# Patient Record
Sex: Female | Born: 1960 | Race: White | Hispanic: No | Smoking: Former smoker
Health system: Southern US, Community
[De-identification: ages and names within clinical notes are randomized; demographics above are authoritative.]

## PROBLEM LIST (undated history)

## (undated) DIAGNOSIS — M8580 Other specified disorders of bone density and structure, unspecified site: Secondary | ICD-10-CM

## (undated) DIAGNOSIS — E559 Vitamin D deficiency, unspecified: Secondary | ICD-10-CM

## (undated) HISTORY — DX: Vitamin D deficiency, unspecified: E55.9

## (undated) HISTORY — DX: Other specified disorders of bone density and structure, unspecified site: M85.80

---

## 2007-08-17 ENCOUNTER — Ambulatory Visit (HOSPITAL_COMMUNITY): Admission: RE | Admit: 2007-08-17 | Discharge: 2007-08-17 | Payer: Self-pay | Admitting: Family Medicine

## 2007-08-21 ENCOUNTER — Encounter: Admission: RE | Admit: 2007-08-21 | Discharge: 2007-08-21 | Payer: Self-pay | Admitting: Family Medicine

## 2007-08-29 ENCOUNTER — Other Ambulatory Visit: Admission: RE | Admit: 2007-08-29 | Discharge: 2007-08-29 | Payer: Self-pay | Admitting: Family Medicine

## 2008-06-27 ENCOUNTER — Ambulatory Visit (HOSPITAL_COMMUNITY): Admission: RE | Admit: 2008-06-27 | Discharge: 2008-06-27 | Payer: Self-pay | Admitting: Family Medicine

## 2008-07-04 ENCOUNTER — Encounter: Admission: RE | Admit: 2008-07-04 | Discharge: 2008-07-04 | Payer: Self-pay | Admitting: Family Medicine

## 2009-01-09 ENCOUNTER — Encounter: Admission: RE | Admit: 2009-01-09 | Discharge: 2009-01-09 | Payer: Self-pay | Admitting: Family Medicine

## 2010-12-20 ENCOUNTER — Encounter: Payer: Self-pay | Admitting: Family Medicine

## 2013-04-10 ENCOUNTER — Other Ambulatory Visit (HOSPITAL_COMMUNITY)
Admission: RE | Admit: 2013-04-10 | Discharge: 2013-04-10 | Disposition: A | Discharge status: Home / Self Care | Payer: BC Managed Care – PPO | Source: Ambulatory Visit | Attending: Family Medicine | Admitting: Family Medicine

## 2013-04-10 ENCOUNTER — Other Ambulatory Visit: Payer: Self-pay | Admitting: Family Medicine

## 2013-04-10 DIAGNOSIS — Z124 Encounter for screening for malignant neoplasm of cervix: Secondary | ICD-10-CM | POA: Insufficient documentation

## 2013-04-25 ENCOUNTER — Other Ambulatory Visit: Payer: Self-pay

## 2013-04-30 ENCOUNTER — Other Ambulatory Visit: Payer: Self-pay | Admitting: Family Medicine

## 2013-04-30 DIAGNOSIS — E2839 Other primary ovarian failure: Secondary | ICD-10-CM

## 2013-05-03 ENCOUNTER — Other Ambulatory Visit: Payer: Self-pay

## 2013-05-03 DIAGNOSIS — Z1231 Encounter for screening mammogram for malignant neoplasm of breast: Secondary | ICD-10-CM

## 2013-06-10 ENCOUNTER — Ambulatory Visit: Payer: Self-pay

## 2013-06-10 ENCOUNTER — Other Ambulatory Visit: Payer: Self-pay

## 2013-06-12 ENCOUNTER — Ambulatory Visit
Admission: RE | Admit: 2013-06-12 | Discharge: 2013-06-12 | Disposition: A | Discharge status: Home / Self Care | Payer: BC Managed Care – PPO | Source: Ambulatory Visit | Attending: Family Medicine | Admitting: Family Medicine

## 2013-06-12 ENCOUNTER — Ambulatory Visit
Admission: RE | Admit: 2013-06-12 | Discharge: 2013-06-12 | Disposition: A | Discharge status: Home / Self Care | Payer: BC Managed Care – PPO | Source: Ambulatory Visit

## 2013-06-12 DIAGNOSIS — Z1231 Encounter for screening mammogram for malignant neoplasm of breast: Secondary | ICD-10-CM

## 2013-06-12 DIAGNOSIS — E2839 Other primary ovarian failure: Secondary | ICD-10-CM

## 2014-05-26 ENCOUNTER — Other Ambulatory Visit: Payer: Self-pay

## 2014-05-26 DIAGNOSIS — Z1231 Encounter for screening mammogram for malignant neoplasm of breast: Secondary | ICD-10-CM

## 2014-05-29 ENCOUNTER — Other Ambulatory Visit: Payer: Self-pay | Admitting: Family Medicine

## 2014-05-29 ENCOUNTER — Other Ambulatory Visit (HOSPITAL_COMMUNITY)
Admission: RE | Admit: 2014-05-29 | Discharge: 2014-05-29 | Disposition: A | Discharge status: Home / Self Care | Payer: BC Managed Care – PPO | Source: Ambulatory Visit | Attending: Family Medicine | Admitting: Family Medicine

## 2014-05-29 DIAGNOSIS — Z1151 Encounter for screening for human papillomavirus (HPV): Secondary | ICD-10-CM | POA: Insufficient documentation

## 2014-05-29 DIAGNOSIS — Z01419 Encounter for gynecological examination (general) (routine) without abnormal findings: Secondary | ICD-10-CM | POA: Insufficient documentation

## 2014-06-02 LAB — CYTOLOGY - PAP

## 2014-06-26 ENCOUNTER — Ambulatory Visit
Admission: RE | Admit: 2014-06-26 | Discharge: 2014-06-26 | Disposition: A | Discharge status: Home / Self Care | Payer: BC Managed Care – PPO | Source: Ambulatory Visit

## 2014-06-26 DIAGNOSIS — Z1231 Encounter for screening mammogram for malignant neoplasm of breast: Secondary | ICD-10-CM

## 2015-06-05 ENCOUNTER — Other Ambulatory Visit: Payer: Self-pay | Admitting: Family Medicine

## 2015-06-05 DIAGNOSIS — M8580 Other specified disorders of bone density and structure, unspecified site: Secondary | ICD-10-CM

## 2015-06-26 ENCOUNTER — Other Ambulatory Visit: Payer: Self-pay

## 2015-06-26 ENCOUNTER — Other Ambulatory Visit: Payer: Self-pay | Admitting: Family Medicine

## 2015-06-26 DIAGNOSIS — Z1231 Encounter for screening mammogram for malignant neoplasm of breast: Secondary | ICD-10-CM

## 2015-07-01 ENCOUNTER — Ambulatory Visit
Admission: RE | Admit: 2015-07-01 | Discharge: 2015-07-01 | Disposition: A | Discharge status: Home / Self Care | Payer: BLUE CROSS/BLUE SHIELD | Source: Ambulatory Visit

## 2015-07-01 ENCOUNTER — Ambulatory Visit
Admission: RE | Admit: 2015-07-01 | Discharge: 2015-07-01 | Disposition: A | Discharge status: Home / Self Care | Payer: BLUE CROSS/BLUE SHIELD | Source: Ambulatory Visit | Attending: Family Medicine | Admitting: Family Medicine

## 2015-07-01 DIAGNOSIS — M8580 Other specified disorders of bone density and structure, unspecified site: Secondary | ICD-10-CM

## 2015-07-01 DIAGNOSIS — Z1231 Encounter for screening mammogram for malignant neoplasm of breast: Secondary | ICD-10-CM

## 2015-09-23 ENCOUNTER — Ambulatory Visit (INDEPENDENT_AMBULATORY_CARE_PROVIDER_SITE_OTHER): Payer: BLUE CROSS/BLUE SHIELD | Admitting: Interventional Cardiology

## 2015-09-23 ENCOUNTER — Encounter: Payer: Self-pay | Admitting: Interventional Cardiology

## 2015-09-23 VITALS — BP 100/66 | HR 60 | Ht 64.0 in | Wt 178.1 lb

## 2015-09-23 DIAGNOSIS — R0609 Other forms of dyspnea: Secondary | ICD-10-CM

## 2015-09-23 NOTE — Patient Instructions (Signed)
Medication Instructions:  Same-no changes  Labwork: None  Testing/Procedures: Your physician has requested that you have a stress echocardiogram. For further information please visit [email or web address]. Please follow instruction sheet as given.   Follow-Up: Your physician recommends that you schedule a follow-up appointment in: as needed      If you need a refill on your cardiac medications before your next appointment, please call your pharmacy.   

## 2015-09-23 NOTE — Progress Notes (Signed)
Patient ID: Latoya Cunningham, female   DOB: [birth year], 54 y.o.   MRN: [id number]     Cardiology Office Note   Date:  09/23/2015   ID:  Tyrina Hines, DOB 1961-10-15, MRN [id number]  PCP:  Gaye Alken, MD    No chief complaint on file. shortness of breath   Wt Readings from Last 3 Encounters:  09/23/15 178 lb 1.9 oz (80.795 kg)       History of Present Illness: Latoya Cunningham is a 54 y.o. female  who has seen several years ago for shortness of breath. She was referred for a stress test but did not follow-up for the test. She had been doing well until the last few months. She has noticed increasing shortness of breath, particularly with exertion. [place] and had significant shortness of breath to the point that she had to stop walking. She denies any discomfort in her chest. No associated sweating, nausea, arm pain.  No lower extremity swelling.  She reports easy bruising. She had been taking some over-the-counter supplements for back pain. Her liver tests apparently were elevated. When she's changed her medicines, they came down. She now takes Naprosyn for back pain.    Past Medical History  Diagnosis Date  . Vitamin D deficiency   . Osteopenia     No past surgical history on file.   Current Outpatient Prescriptions  Medication Sig Dispense Refill  . cholecalciferol (VITAMIN D) 1000 UNITS tablet Take 1,000 Units by mouth daily.    . naproxen sodium (ANAPROX) 220 MG tablet Take 220 mg by mouth 2 (two) times daily as needed (Back Pain).      No current facility-administered medications for this visit.    Allergies:   Review of patient's allergies indicates no known allergies.    Social History:  The patient  reports that she quit smoking about 7 years ago. She has never used smokeless tobacco. She reports that she drinks alcohol. She reports that she does not use illicit drugs.   Family History:  The patient's *family history includes  Arthritis in her mother; Bipolar disorder in her sister; Bone cancer in her maternal aunt; Breast cancer in her maternal aunt and maternal aunt; Cancer in her maternal grandfather and maternal grandmother; Healthy in her brother, brother, brother, and brother; Multiple sclerosis in her sister.    ROS:  Please see the history of present illness.   Otherwise, review of systems are positive for dyspnea on exertion.   All other systems are reviewed and negative.    PHYSICAL EXAM: VS:  BP 100/66 mmHg  Pulse 60  Ht  (1.626 m)  Wt 178 lb 1.9 oz (80.795 kg)  BMI 30.56 kg/m2 , BMI Body mass index is 30.56 kg/(m^2). GEN: Well nourished, well developed, in no acute distress HEENT: normal Neck: no JVD, carotid bruits, or masses Cardiac: RRR; no murmurs, rubs, or gallops,no edema  Respiratory:  clear to auscultation bilaterally, normal work of breathing GI: soft, nontender, nondistended, + BS MS: no deformity or atrophy Skin: warm and dry, no rash Neuro:  Strength and sensation are intact Psych: euthymic mood, full affect   EKG:   The ekg ordered today demonstrates NSR, normal ECG   Recent Labs: No results found for requested labs within last 365 days.   Lipid Panel No results found for: CHOL, TRIG, HDL, CHOLHDL, VLDL, LDLCALC, LDLDIRECT   Other studies Reviewed: Additional studies/ records that were reviewed today with results demonstrating: PMD notes  reviewed.   ASSESSMENT AND PLAN:  1. Dyspnea on exertion: We'll evaluate for ischemia with a stress echo. Overall, I think she is at low pretest probability for severe coronary artery disease. She is not an active smoker. She does not have a family history of early CAD. However, since this is her second occurrence of potentially ischemic symptoms, it would be beneficial to rule out ischemia. 2. Avoid too much Naprosyn.  Can contribute to increased bruising. 3. Follow-up based on results of the stress echo.   Current medicines are  reviewed at length with the patient today.  The patient concerns regarding her medicines were addressed.  The following changes have been made:  No change  Labs/ tests ordered today include:  No orders of the defined types were placed in this encounter.    Recommend 150 minutes/week of aerobic exercise Low fat, low carb, high fiber diet recommended  Disposition:   FU in prn   Delorise JacksonSigned, Darrian Goodwill S., MD  09/23/2015 3:55 PM    [facility] [address], [place], [place]  [address] Phone: [phone]; Fax: [phone]

## 2015-10-01 ENCOUNTER — Ambulatory Visit (HOSPITAL_BASED_OUTPATIENT_CLINIC_OR_DEPARTMENT_OTHER): Payer: BLUE CROSS/BLUE SHIELD

## 2015-10-01 ENCOUNTER — Ambulatory Visit (HOSPITAL_COMMUNITY): Payer: BLUE CROSS/BLUE SHIELD | Attending: Internal Medicine

## 2015-10-01 DIAGNOSIS — R0600 Dyspnea, unspecified: Secondary | ICD-10-CM | POA: Insufficient documentation

## 2015-10-01 DIAGNOSIS — R0609 Other forms of dyspnea: Secondary | ICD-10-CM

## 2015-10-01 DIAGNOSIS — R0989 Other specified symptoms and signs involving the circulatory and respiratory systems: Secondary | ICD-10-CM

## 2015-10-07 ENCOUNTER — Other Ambulatory Visit (HOSPITAL_COMMUNITY): Payer: BLUE CROSS/BLUE SHIELD

## 2016-07-18 ENCOUNTER — Other Ambulatory Visit: Payer: Self-pay | Admitting: Physician Assistant

## 2016-07-18 ENCOUNTER — Other Ambulatory Visit (HOSPITAL_COMMUNITY)
Admission: RE | Admit: 2016-07-18 | Discharge: 2016-07-18 | Disposition: A | Discharge status: Home / Self Care | Payer: BLUE CROSS/BLUE SHIELD | Source: Ambulatory Visit | Attending: Family Medicine | Admitting: Family Medicine

## 2016-07-18 DIAGNOSIS — Z124 Encounter for screening for malignant neoplasm of cervix: Secondary | ICD-10-CM | POA: Insufficient documentation

## 2016-07-19 LAB — CYTOLOGY - PAP

## 2016-08-25 ENCOUNTER — Other Ambulatory Visit: Payer: Self-pay | Admitting: Family Medicine

## 2016-08-25 DIAGNOSIS — Z1231 Encounter for screening mammogram for malignant neoplasm of breast: Secondary | ICD-10-CM

## 2016-09-12 ENCOUNTER — Ambulatory Visit
Admission: RE | Admit: 2016-09-12 | Discharge: 2016-09-12 | Disposition: A | Discharge status: Home / Self Care | Payer: BLUE CROSS/BLUE SHIELD | Source: Ambulatory Visit | Attending: Family Medicine | Admitting: Family Medicine

## 2016-09-12 DIAGNOSIS — Z1231 Encounter for screening mammogram for malignant neoplasm of breast: Secondary | ICD-10-CM

## 2016-09-15 ENCOUNTER — Other Ambulatory Visit: Payer: Self-pay | Admitting: Family Medicine

## 2016-09-15 DIAGNOSIS — R928 Other abnormal and inconclusive findings on diagnostic imaging of breast: Secondary | ICD-10-CM

## 2016-10-03 ENCOUNTER — Ambulatory Visit
Admission: RE | Admit: 2016-10-03 | Discharge: 2016-10-03 | Disposition: A | Discharge status: Home / Self Care | Payer: BLUE CROSS/BLUE SHIELD | Source: Ambulatory Visit | Attending: Family Medicine | Admitting: Family Medicine

## 2016-10-03 ENCOUNTER — Other Ambulatory Visit: Payer: Self-pay | Admitting: Family Medicine

## 2016-10-03 DIAGNOSIS — R928 Other abnormal and inconclusive findings on diagnostic imaging of breast: Secondary | ICD-10-CM

## 2017-10-25 ENCOUNTER — Other Ambulatory Visit: Payer: Self-pay | Admitting: Family Medicine

## 2017-10-25 DIAGNOSIS — Z1231 Encounter for screening mammogram for malignant neoplasm of breast: Secondary | ICD-10-CM

## 2017-11-24 ENCOUNTER — Ambulatory Visit
Admission: RE | Admit: 2017-11-24 | Discharge: 2017-11-24 | Disposition: A | Discharge status: Home / Self Care | Payer: BLUE CROSS/BLUE SHIELD | Source: Ambulatory Visit | Attending: Family Medicine | Admitting: Family Medicine

## 2017-11-24 DIAGNOSIS — Z1231 Encounter for screening mammogram for malignant neoplasm of breast: Secondary | ICD-10-CM

## 2018-08-22 ENCOUNTER — Other Ambulatory Visit: Payer: Self-pay | Admitting: Family Medicine

## 2018-08-22 ENCOUNTER — Other Ambulatory Visit (HOSPITAL_COMMUNITY)
Admission: RE | Admit: 2018-08-22 | Discharge: 2018-08-22 | Disposition: A | Discharge status: Home / Self Care | Payer: Managed Care, Other (non HMO) | Source: Ambulatory Visit | Attending: Family Medicine | Admitting: Family Medicine

## 2018-08-22 DIAGNOSIS — Z124 Encounter for screening for malignant neoplasm of cervix: Secondary | ICD-10-CM | POA: Insufficient documentation

## 2018-08-28 LAB — CYTOLOGY - PAP
Diagnosis: NEGATIVE
HPV: NOT DETECTED

## 2018-09-24 ENCOUNTER — Other Ambulatory Visit: Payer: Self-pay | Admitting: Family Medicine

## 2018-09-24 DIAGNOSIS — Z1231 Encounter for screening mammogram for malignant neoplasm of breast: Secondary | ICD-10-CM

## 2018-09-25 ENCOUNTER — Other Ambulatory Visit: Payer: Self-pay | Admitting: Family Medicine

## 2018-09-25 DIAGNOSIS — M8580 Other specified disorders of bone density and structure, unspecified site: Secondary | ICD-10-CM

## 2018-11-26 ENCOUNTER — Ambulatory Visit: Payer: Managed Care, Other (non HMO)

## 2018-11-26 ENCOUNTER — Ambulatory Visit
Admission: RE | Admit: 2018-11-26 | Discharge: 2018-11-26 | Disposition: A | Discharge status: Home / Self Care | Payer: Managed Care, Other (non HMO) | Source: Ambulatory Visit | Attending: Family Medicine | Admitting: Family Medicine

## 2018-11-26 DIAGNOSIS — Z1231 Encounter for screening mammogram for malignant neoplasm of breast: Secondary | ICD-10-CM

## 2019-05-10 ENCOUNTER — Other Ambulatory Visit (HOSPITAL_COMMUNITY)
Admission: RE | Admit: 2019-05-10 | Discharge: 2019-05-10 | Disposition: A | Discharge status: Home / Self Care | Payer: 59 | Source: Ambulatory Visit | Attending: Family Medicine | Admitting: Family Medicine

## 2019-05-10 ENCOUNTER — Other Ambulatory Visit: Payer: Self-pay | Admitting: Family Medicine

## 2019-05-10 DIAGNOSIS — Z124 Encounter for screening for malignant neoplasm of cervix: Secondary | ICD-10-CM | POA: Diagnosis present

## 2019-05-16 LAB — CYTOLOGY - PAP
Diagnosis: UNDETERMINED — AB
HPV: DETECTED — AB

## 2019-08-19 ENCOUNTER — Other Ambulatory Visit: Payer: Self-pay | Admitting: Family Medicine

## 2019-08-19 DIAGNOSIS — Z1231 Encounter for screening mammogram for malignant neoplasm of breast: Secondary | ICD-10-CM

## 2019-12-10 ENCOUNTER — Ambulatory Visit
Admission: RE | Admit: 2019-12-10 | Discharge: 2019-12-10 | Disposition: A | Discharge status: Home / Self Care | Payer: No Typology Code available for payment source | Source: Ambulatory Visit | Attending: Family Medicine | Admitting: Family Medicine

## 2019-12-10 ENCOUNTER — Other Ambulatory Visit: Payer: Self-pay

## 2019-12-10 DIAGNOSIS — Z1231 Encounter for screening mammogram for malignant neoplasm of breast: Secondary | ICD-10-CM

## 2020-11-30 ENCOUNTER — Ambulatory Visit (HOSPITAL_COMMUNITY): Payer: Self-pay

## 2021-01-20 ENCOUNTER — Other Ambulatory Visit: Payer: Self-pay | Admitting: Family Medicine

## 2021-01-20 DIAGNOSIS — Z1231 Encounter for screening mammogram for malignant neoplasm of breast: Secondary | ICD-10-CM

## 2021-03-16 ENCOUNTER — Ambulatory Visit: Payer: No Typology Code available for payment source

## 2021-05-22 IMAGING — MG DIGITAL SCREENING BILAT W/ CAD
4 series · 4 of 4 positions shown · non-contrast
Comparison: Previous exam(s).

CLINICAL DATA: Screening.

EXAM:
DIGITAL SCREENING BILATERAL MAMMOGRAM WITH CAD

[L CC]
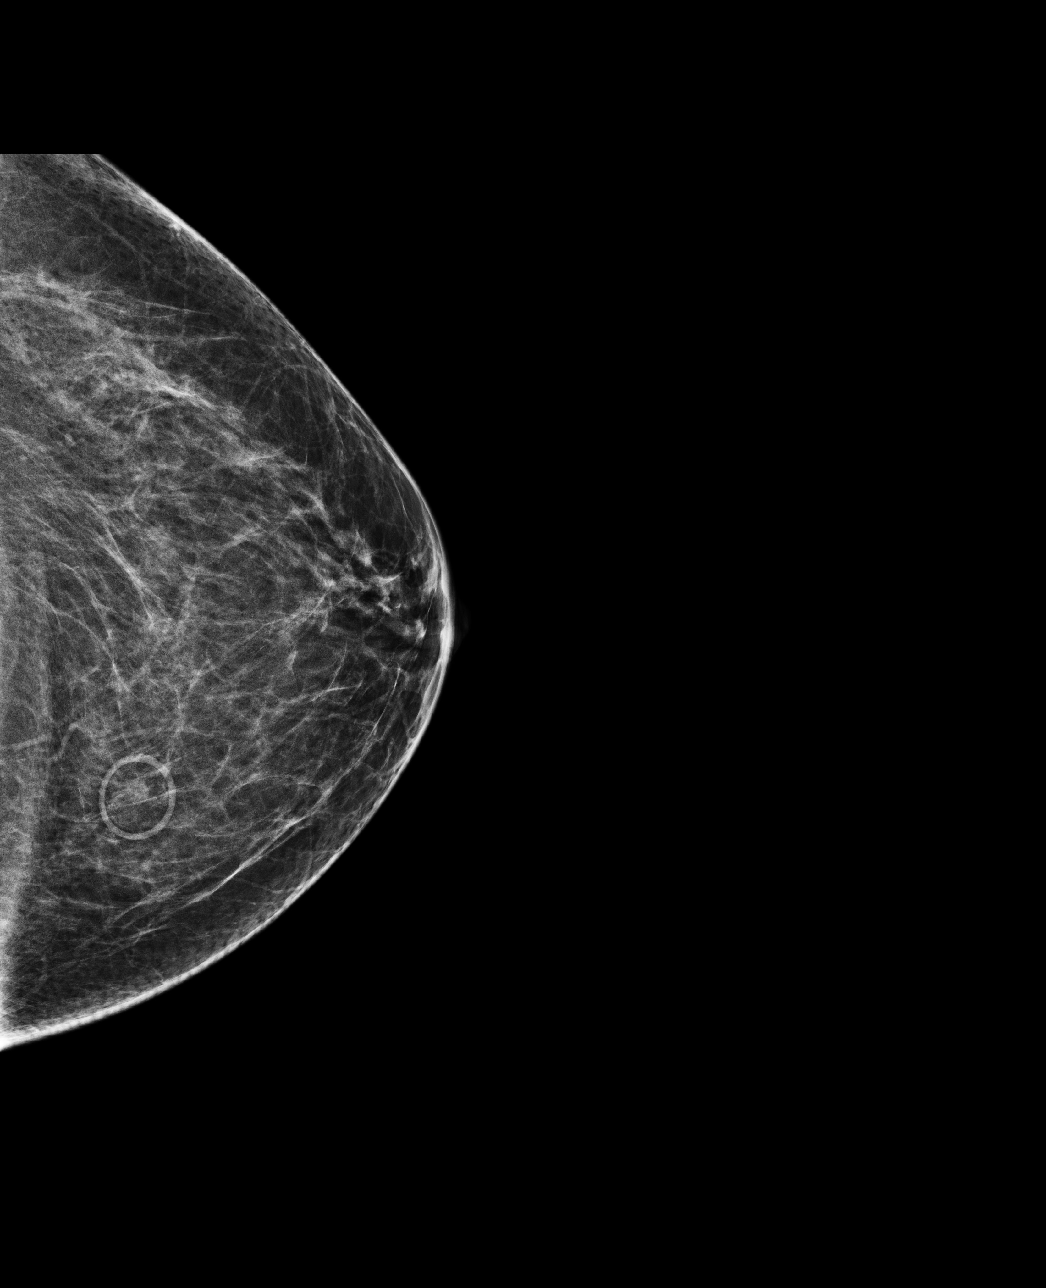

[R MLO]
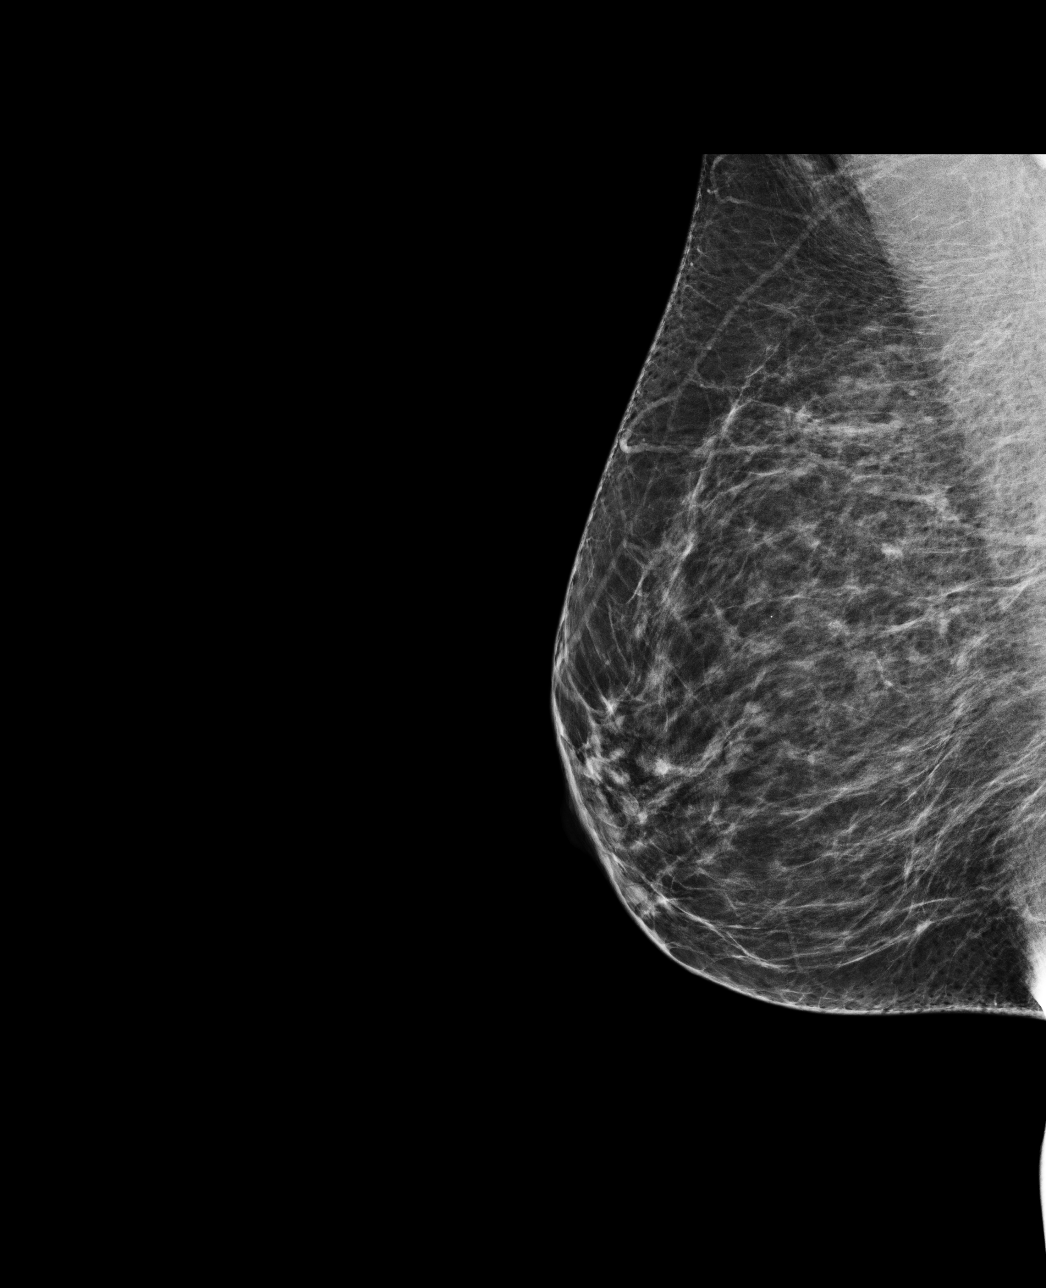

[R CC]
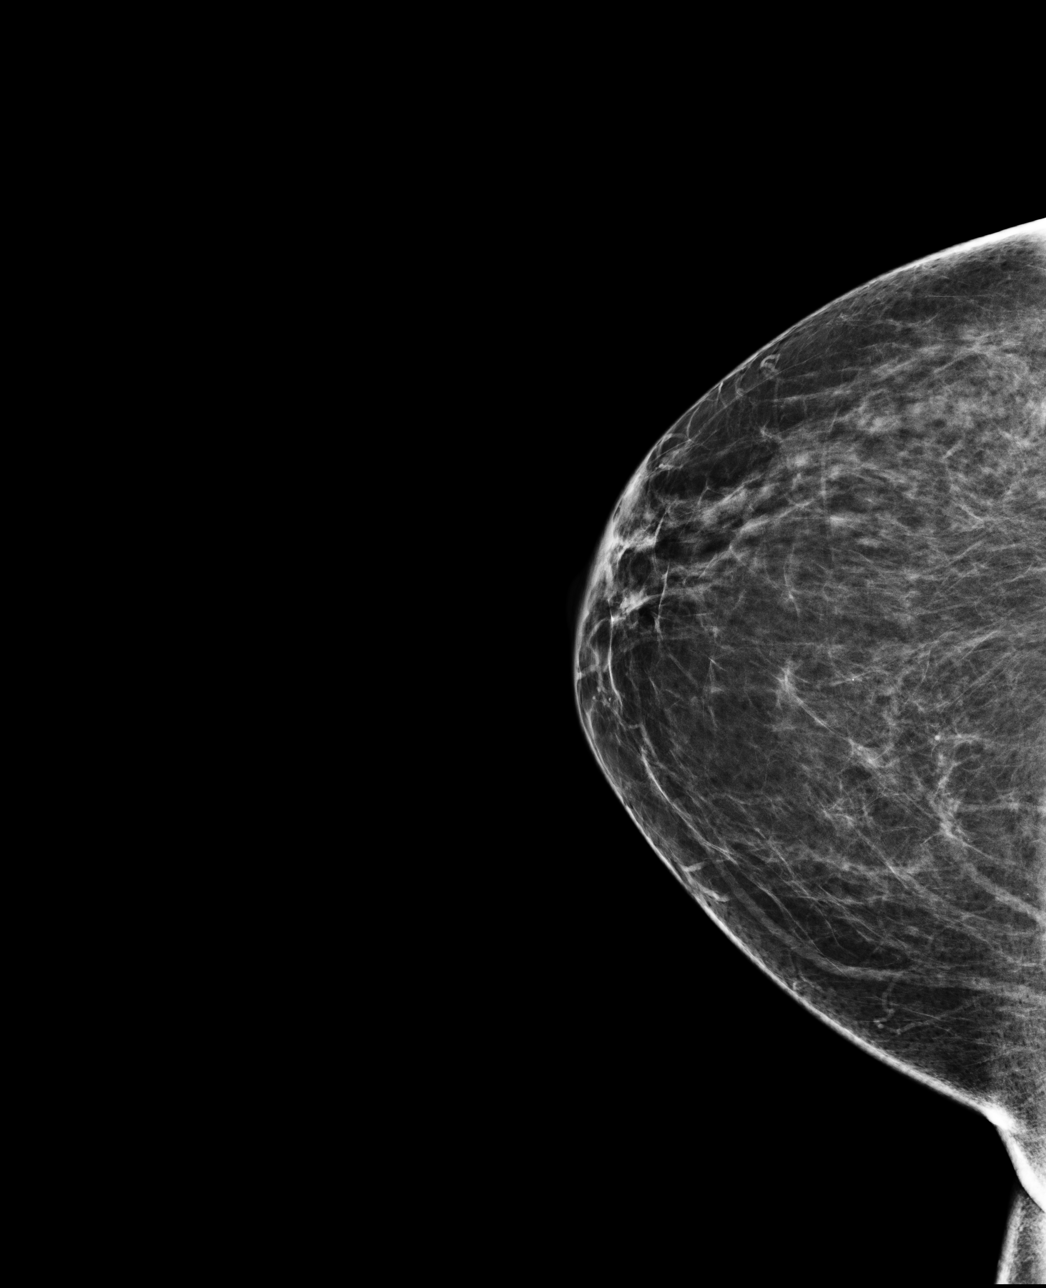

[L MLO]
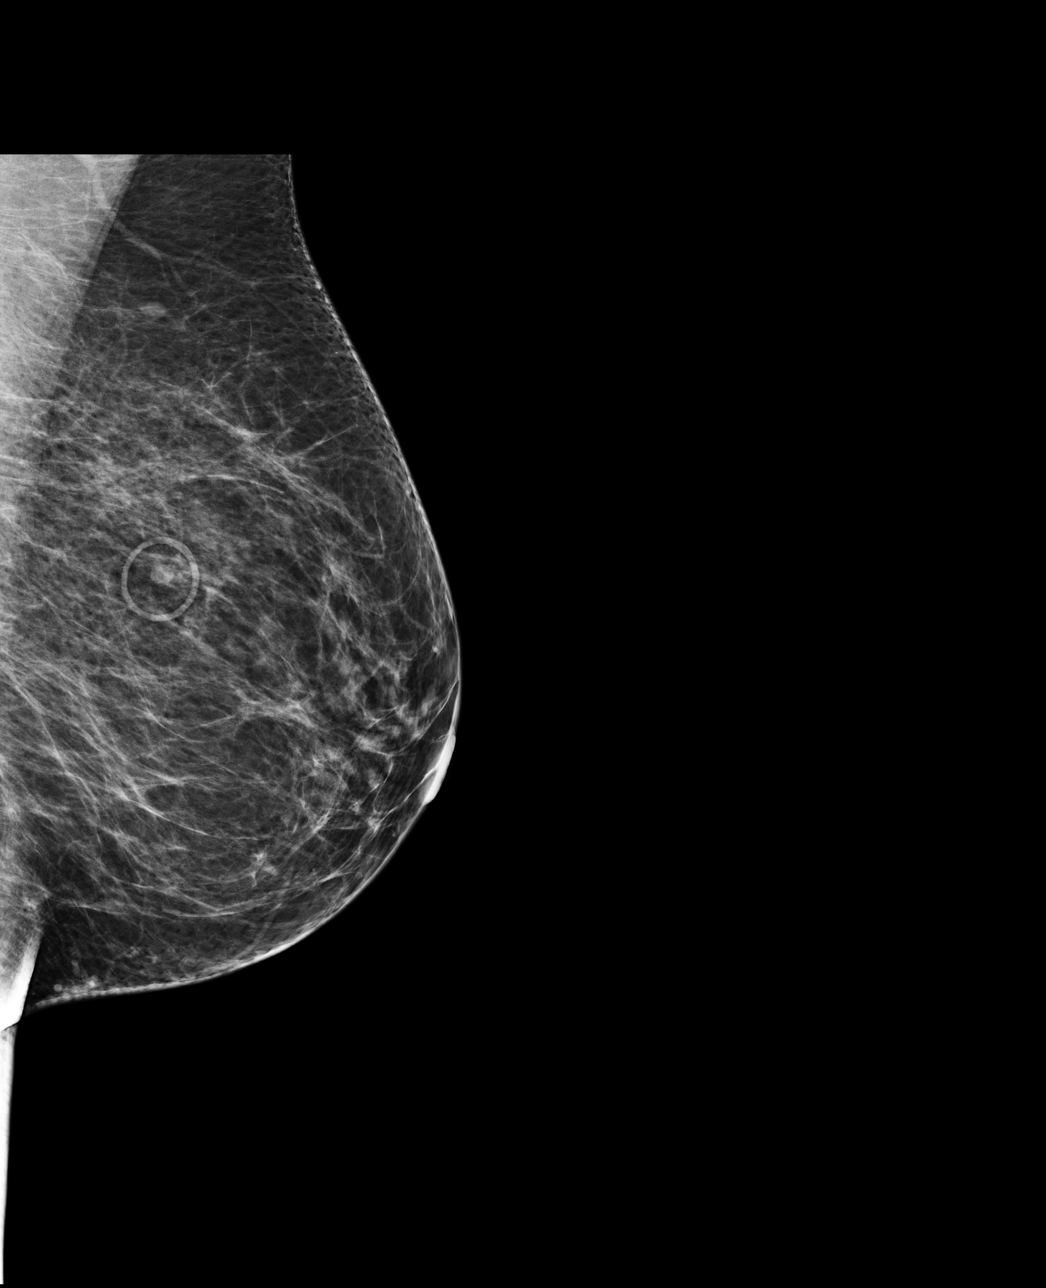

[4 of 4 positions shown; findings below may reference images not displayed]

ACR Breast Density Category c: The breast tissue is heterogeneously
dense, which may obscure small masses.
FINDINGS: There are no findings suspicious for malignancy. Images were
processed with CAD.
IMPRESSION: No mammographic evidence of malignancy. A result letter of this
screening mammogram will be mailed directly to the patient.

RECOMMENDATION:
Screening mammogram in one year. (Code:YJ-2-FEZ)

BI-RADS CATEGORY  1: Negative.

## 2023-01-16 DIAGNOSIS — D3615 Benign neoplasm of peripheral nerves and autonomic nervous system of abdomen: Secondary | ICD-10-CM | POA: Diagnosis not present

## 2023-01-16 DIAGNOSIS — Z1283 Encounter for screening for malignant neoplasm of skin: Secondary | ICD-10-CM | POA: Diagnosis not present

## 2023-01-16 DIAGNOSIS — L821 Other seborrheic keratosis: Secondary | ICD-10-CM | POA: Diagnosis not present

## 2023-01-16 DIAGNOSIS — D225 Melanocytic nevi of trunk: Secondary | ICD-10-CM | POA: Diagnosis not present

## 2023-01-16 DIAGNOSIS — D216 Benign neoplasm of connective and other soft tissue of trunk, unspecified: Secondary | ICD-10-CM | POA: Diagnosis not present

## 2023-03-08 DIAGNOSIS — Z124 Encounter for screening for malignant neoplasm of cervix: Secondary | ICD-10-CM | POA: Diagnosis not present

## 2023-03-08 DIAGNOSIS — Z01419 Encounter for gynecological examination (general) (routine) without abnormal findings: Secondary | ICD-10-CM | POA: Diagnosis not present

## 2023-03-08 DIAGNOSIS — Z6830 Body mass index (BMI) 30.0-30.9, adult: Secondary | ICD-10-CM | POA: Diagnosis not present

## 2023-03-08 DIAGNOSIS — Z1151 Encounter for screening for human papillomavirus (HPV): Secondary | ICD-10-CM | POA: Diagnosis not present

## 2023-07-14 DIAGNOSIS — E559 Vitamin D deficiency, unspecified: Secondary | ICD-10-CM | POA: Diagnosis not present

## 2023-07-14 DIAGNOSIS — E7800 Pure hypercholesterolemia, unspecified: Secondary | ICD-10-CM | POA: Diagnosis not present

## 2023-07-14 DIAGNOSIS — Z0000 Encounter for general adult medical examination without abnormal findings: Secondary | ICD-10-CM | POA: Diagnosis not present

## 2023-08-10 DIAGNOSIS — Z1231 Encounter for screening mammogram for malignant neoplasm of breast: Secondary | ICD-10-CM | POA: Diagnosis not present

## 2023-08-15 ENCOUNTER — Other Ambulatory Visit: Payer: Self-pay | Admitting: Obstetrics and Gynecology

## 2023-08-15 DIAGNOSIS — R928 Other abnormal and inconclusive findings on diagnostic imaging of breast: Secondary | ICD-10-CM

## 2023-08-17 ENCOUNTER — Ambulatory Visit: Payer: No Typology Code available for payment source

## 2023-08-17 ENCOUNTER — Ambulatory Visit
Admission: RE | Admit: 2023-08-17 | Discharge: 2023-08-17 | Disposition: A | Discharge status: Home / Self Care | Payer: BLUE CROSS/BLUE SHIELD | Source: Ambulatory Visit | Attending: Obstetrics and Gynecology | Admitting: Obstetrics and Gynecology

## 2023-08-17 DIAGNOSIS — R928 Other abnormal and inconclusive findings on diagnostic imaging of breast: Secondary | ICD-10-CM | POA: Diagnosis not present

## 2023-08-22 ENCOUNTER — Other Ambulatory Visit: Payer: No Typology Code available for payment source

## 2023-08-23 ENCOUNTER — Other Ambulatory Visit: Payer: No Typology Code available for payment source

## 2024-04-29 DIAGNOSIS — L821 Other seborrheic keratosis: Secondary | ICD-10-CM | POA: Diagnosis not present

## 2024-04-29 DIAGNOSIS — D2272 Melanocytic nevi of left lower limb, including hip: Secondary | ICD-10-CM | POA: Diagnosis not present

## 2024-04-29 DIAGNOSIS — D225 Melanocytic nevi of trunk: Secondary | ICD-10-CM | POA: Diagnosis not present

## 2024-04-29 DIAGNOSIS — D485 Neoplasm of uncertain behavior of skin: Secondary | ICD-10-CM | POA: Diagnosis not present

## 2024-07-26 DIAGNOSIS — M8588 Other specified disorders of bone density and structure, other site: Secondary | ICD-10-CM | POA: Diagnosis not present

## 2024-07-26 DIAGNOSIS — Z6830 Body mass index (BMI) 30.0-30.9, adult: Secondary | ICD-10-CM | POA: Diagnosis not present

## 2024-07-26 DIAGNOSIS — E559 Vitamin D deficiency, unspecified: Secondary | ICD-10-CM | POA: Diagnosis not present

## 2024-07-26 DIAGNOSIS — Z0000 Encounter for general adult medical examination without abnormal findings: Secondary | ICD-10-CM | POA: Diagnosis not present

## 2024-10-23 DIAGNOSIS — Z1231 Encounter for screening mammogram for malignant neoplasm of breast: Secondary | ICD-10-CM | POA: Diagnosis not present

## 2024-10-23 DIAGNOSIS — Z6831 Body mass index (BMI) 31.0-31.9, adult: Secondary | ICD-10-CM | POA: Diagnosis not present

## 2024-10-23 DIAGNOSIS — Z124 Encounter for screening for malignant neoplasm of cervix: Secondary | ICD-10-CM | POA: Diagnosis not present

## 2024-10-23 DIAGNOSIS — Z01419 Encounter for gynecological examination (general) (routine) without abnormal findings: Secondary | ICD-10-CM | POA: Diagnosis not present

## 2024-10-23 DIAGNOSIS — Z1151 Encounter for screening for human papillomavirus (HPV): Secondary | ICD-10-CM | POA: Diagnosis not present
